# Patient Record
Sex: Female | Born: 2000 | Race: White | Hispanic: No | Marital: Single | Smoking: Never smoker
Health system: Southern US, Community
[De-identification: ages and names within clinical notes are randomized; demographics above are authoritative.]

## PROBLEM LIST (undated history)

## (undated) DIAGNOSIS — Z23 Encounter for immunization: Secondary | ICD-10-CM

## (undated) HISTORY — PX: NO PAST SURGERIES: SHX2092

## (undated) HISTORY — DX: Encounter for immunization: Z23

---
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
## 2020-10-25 NOTE — Progress Notes (Signed)
Patient, No Pcp Per   Chief Complaint  Patient presents with  . Breast Exam    Lump on RB, not painful x 2 weeks    HPI:      Ms. Dorothy Reid is a 20 y.o. No obstetric history on file. whose LMP was No LMP recorded. Patient has had an implant., presents today for NP RT breast mass for the past 2 wks, noticed on SBE. May have gotten a little larger since she first noticed it, minimally tender. No erythema, trauma, nipple d/c. No FH breast/ovar ca. Drinks minimal caffeine. No prior breast imaging, no hx of breast masses. Has occas BTB with nexplanon, replaced 06/2019. She is sex active, never had pap. Has bleeding today and declines GYN exam.    Past Medical History:  Diagnosis Date  . No pertinent past medical history     Past Surgical History:  Procedure Laterality Date  . NO PAST SURGERIES      Family History  Problem Relation Age of Onset  . Other Mother        had hysterectomy due to cancer cells  . Other Maternal Grandmother        had hysterectomy due to cancer cells    Social History   Socioeconomic History  . Marital status: Single    Spouse name: Not on file  . Number of children: Not on file  . Years of education: Not on file  . Highest education level: Not on file  Occupational History  . Not on file  Tobacco Use  . Smoking status: Never Smoker  . Smokeless tobacco: Never Used  Vaping Use  . Vaping Use: Never used  Substance and Sexual Activity  . Alcohol use: Yes  . Drug use: Never  . Sexual activity: Yes    Birth control/protection: Implant  Other Topics Concern  . Not on file  Social History Narrative  . Not on file   Social Determinants of Health   Financial Resource Strain: Not on file  Food Insecurity: Not on file  Transportation Needs: Not on file  Physical Activity: Not on file  Stress: Not on file  Social Connections: Not on file  Intimate Partner Violence: Not on file    Outpatient Medications Prior to Visit   Medication Sig Dispense Refill  . etonogestrel (NEXPLANON) 68 MG IMPL implant Inject into the skin once. Approx insertion date 06/2019     No facility-administered medications prior to visit.      ROS:  Review of Systems  Constitutional: Negative for fever.  Gastrointestinal: Negative for blood in stool, constipation, diarrhea, nausea and vomiting.  Genitourinary: Negative for dyspareunia, dysuria, flank pain, frequency, hematuria, urgency, vaginal bleeding, vaginal discharge and vaginal pain.  Musculoskeletal: Negative for back pain.  Skin: Negative for rash.   BREAST: mass   OBJECTIVE:   Vitals:  BP 110/80   Ht 5\' 1"  (1.549 m)   Wt 135 lb (61.2 kg)   BMI 25.51 kg/m   Physical Exam Vitals reviewed.  Pulmonary:     Effort: Pulmonary effort is normal.  Chest:  Breasts: Breasts are symmetrical.     Right: Mass present. No inverted nipple, nipple discharge, skin change or tenderness.     Left: No inverted nipple, mass, nipple discharge, skin change or tenderness.     Musculoskeletal:        General: Normal range of motion.     Cervical back: Normal range of motion.  Skin:  General: Skin is warm and dry.  Neurological:     General: No focal deficit present.     Mental Status: She is alert and oriented to person, place, and time.     Cranial Nerves: No cranial nerve deficit.  Psychiatric:        Mood and Affect: Mood normal.        Behavior: Behavior normal.        Thought Content: Thought content normal.        Judgment: Judgment normal.     Assessment/Plan: Breast mass, right - Plan: US BREAST LTD UNI RIGHT INC AXILLA; 10:00 position. Check breast u/s, will f/u with results.     Return in about 4 weeks (around 11/26/2020) for annual.  Cedarius Kersh B. Ilianna Bown, PA-C 10/29/2020 9:28 AM

## 2020-10-25 NOTE — Patient Instructions (Signed)
I value your feedback and you entrusting us with your care. If you get a [facility] patient survey, I would appreciate you taking the time to let us know about your experience today. Thank you! ? ? ?

## 2020-10-29 ENCOUNTER — Other Ambulatory Visit: Payer: Self-pay

## 2020-10-29 ENCOUNTER — Ambulatory Visit (INDEPENDENT_AMBULATORY_CARE_PROVIDER_SITE_OTHER): Payer: Commercial Managed Care - PPO | Admitting: Obstetrics and Gynecology

## 2020-10-29 ENCOUNTER — Encounter: Payer: Self-pay | Admitting: Obstetrics and Gynecology

## 2020-10-29 VITALS — BP 110/80 | Ht 61.0 in | Wt 135.0 lb

## 2020-10-29 DIAGNOSIS — N6310 Unspecified lump in the right breast, unspecified quadrant: Secondary | ICD-10-CM

## 2020-11-01 ENCOUNTER — Telehealth: Payer: Self-pay | Admitting: Obstetrics and Gynecology

## 2020-11-01 NOTE — Telephone Encounter (Signed)
Patient aware of time and date at [facility] on 11/06/2020 at 3:00pm.

## 2020-11-06 ENCOUNTER — Ambulatory Visit
Admission: RE | Admit: 2020-11-06 | Discharge: 2020-11-06 | Disposition: A | Discharge status: Home / Self Care | Payer: Commercial Managed Care - PPO | Source: Ambulatory Visit | Attending: Obstetrics and Gynecology | Admitting: Obstetrics and Gynecology

## 2020-11-06 ENCOUNTER — Other Ambulatory Visit: Payer: Self-pay

## 2020-11-06 DIAGNOSIS — N6310 Unspecified lump in the right breast, unspecified quadrant: Secondary | ICD-10-CM | POA: Insufficient documentation

## 2020-11-08 ENCOUNTER — Encounter: Payer: Commercial Managed Care - PPO | Admitting: Obstetrics and Gynecology

## 2020-12-04 NOTE — Patient Instructions (Incomplete)
I value your feedback and you entrusting us with your care. If you get a [facility] patient survey, I would appreciate you taking the time to let us know about your experience today. Thank you! ? ? ?

## 2020-12-04 NOTE — Progress Notes (Deleted)
   PCP:  Lonie Peak, PA-C   No chief complaint on file.    HPI:      Ms. Dorothy Reid is a 20 y.o. G0P0000 whose LMP was No LMP recorded. Patient has had an implant., presents today for her annual examination.  Her menses are {norm/abn:715}, lasting {number:22536} days.  Dysmenorrhea {dysmen:716}. She {does:18564} have intermenstrual bleeding.  Sex activity: {sex active:315163}. Nexplanon replaced 06/22/19 Last Pap: never Hx of STDs: {STD hx:14358}  There is no FH of breast cancer. There is no FH of ovarian cancer. The patient {does:18564} do self-breast exams. Had RT breast mass with neg breast u/s 3/22; ridge of normal tissue seen. F/u today???  Tobacco use: {tob:20664} Alcohol use: {Alcohol:11675} No drug use.  Exercise: {exercise:31265}  She {does:18564} get adequate calcium and Vitamin D in her diet.   The pregnancy intention screening data noted above was reviewed. Potential methods of contraception were discussed. The patient elected to proceed with {Upstream End Methods:24109}.     Past Medical History:  Diagnosis Date  . Vaccine for human papilloma virus (HPV) types 6, 11, 16, and 18 administered     Past Surgical History:  Procedure Laterality Date  . NO PAST SURGERIES      Family History  Problem Relation Age of Onset  . Other Mother        had hysterectomy due to cx cancer cells  . Other Maternal Grandmother        had hysterectomy due to cx cancer cells    Social History   Socioeconomic History  . Marital status: Single    Spouse name: Not on file  . Number of children: Not on file  . Years of education: Not on file  . Highest education level: Not on file  Occupational History  . Not on file  Tobacco Use  . Smoking status: Never Smoker  . Smokeless tobacco: Never Used  Vaping Use  . Vaping Use: Never used  Substance and Sexual Activity  . Alcohol use: Yes  . Drug use: Never  . Sexual activity: Yes    Birth control/protection:  Implant  Other Topics Concern  . Not on file  Social History Narrative  . Not on file   Social Determinants of Health   Financial Resource Strain: Not on file  Food Insecurity: Not on file  Transportation Needs: Not on file  Physical Activity: Not on file  Stress: Not on file  Social Connections: Not on file  Intimate Partner Violence: Not on file     Current Outpatient Medications:  .  etonogestrel (NEXPLANON) 68 MG IMPL implant, Inject into the skin once. Approx insertion date 06/2019, Disp: , Rfl:      ROS:  Review of Systems BREAST: No symptoms   Objective: There were no vitals taken for this visit.   OBGyn Exam  Results: No results found for this or any previous visit (from the past 24 hour(s)).  Assessment/Plan: No diagnosis found.  No orders of the defined types were placed in this encounter.            GYN counsel {counseling:16159}     F/U  No follow-ups on file.  Derisha Funderburke B. Reginold Beale, PA-C 12/04/2020 2:49 PM

## 2020-12-05 ENCOUNTER — Ambulatory Visit: Payer: Commercial Managed Care - PPO | Admitting: Obstetrics and Gynecology

## 2020-12-05 DIAGNOSIS — Z124 Encounter for screening for malignant neoplasm of cervix: Secondary | ICD-10-CM

## 2020-12-05 DIAGNOSIS — Z01419 Encounter for gynecological examination (general) (routine) without abnormal findings: Secondary | ICD-10-CM

## 2020-12-05 DIAGNOSIS — Z113 Encounter for screening for infections with a predominantly sexual mode of transmission: Secondary | ICD-10-CM

## 2020-12-05 DIAGNOSIS — N6310 Unspecified lump in the right breast, unspecified quadrant: Secondary | ICD-10-CM

## 2020-12-05 DIAGNOSIS — Z3046 Encounter for surveillance of implantable subdermal contraceptive: Secondary | ICD-10-CM

## 2021-08-11 IMAGING — US US BREAST*R* LIMITED INC AXILLA
1 series · 4 of 4 positions shown · non-contrast
Comparison: None.

CLINICAL DATA: 19-year-old female presenting with a new lump in the
right breast.

EXAM:
ULTRASOUND OF THE RIGHT BREAST

[Series 1: us breast*right* limited inc axilla · 0.06mm/px · 4 of 4 slices shown]
[im 1/4]
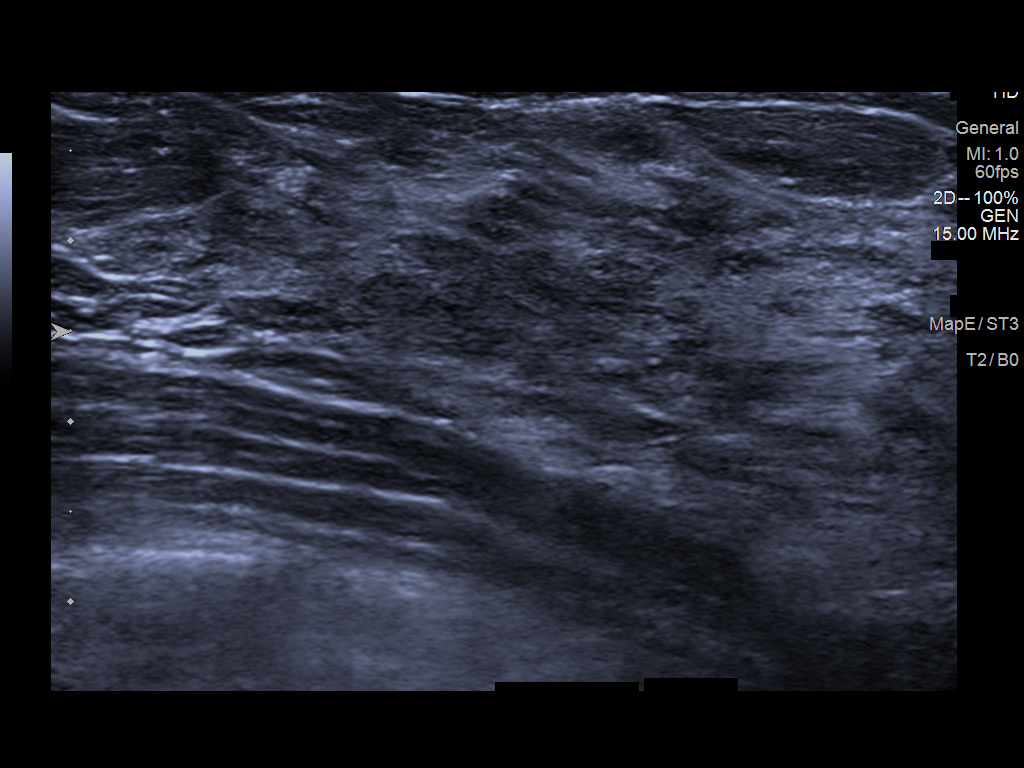
[im 2/4]
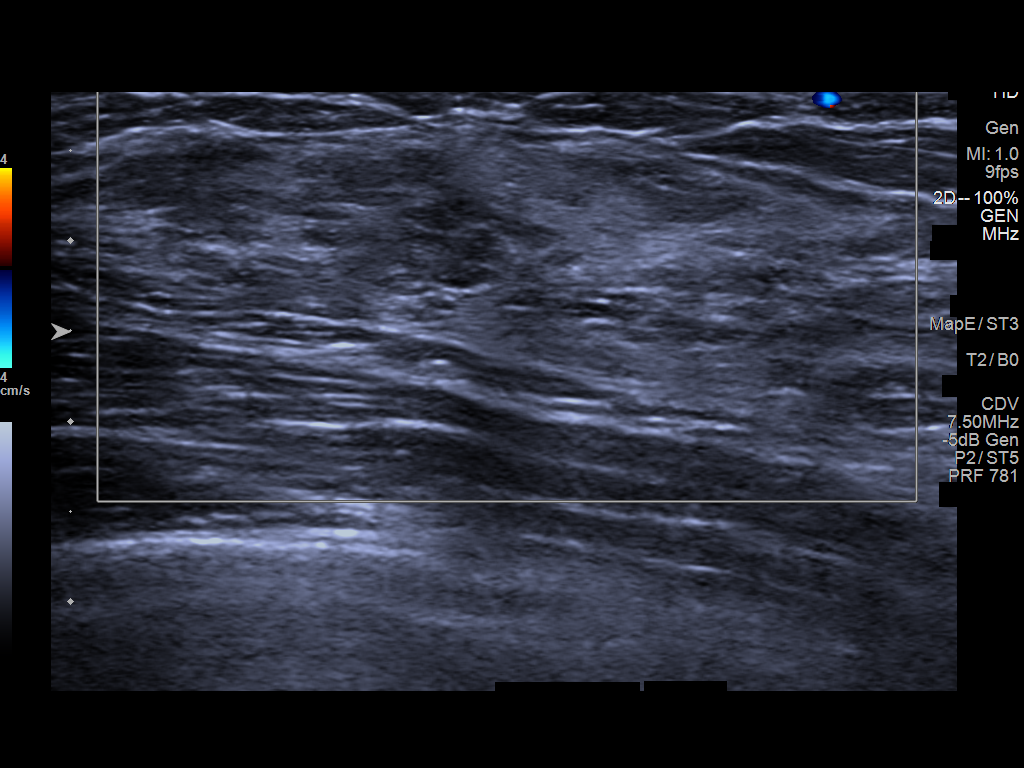
[im 3/4]
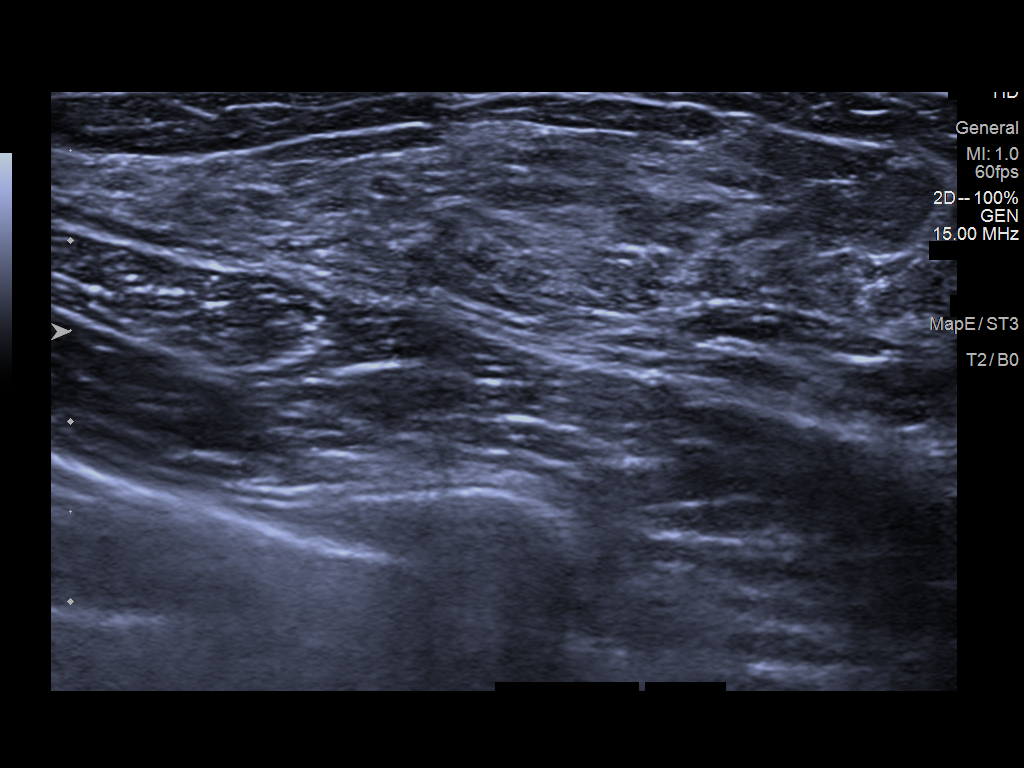
[im 4/4]
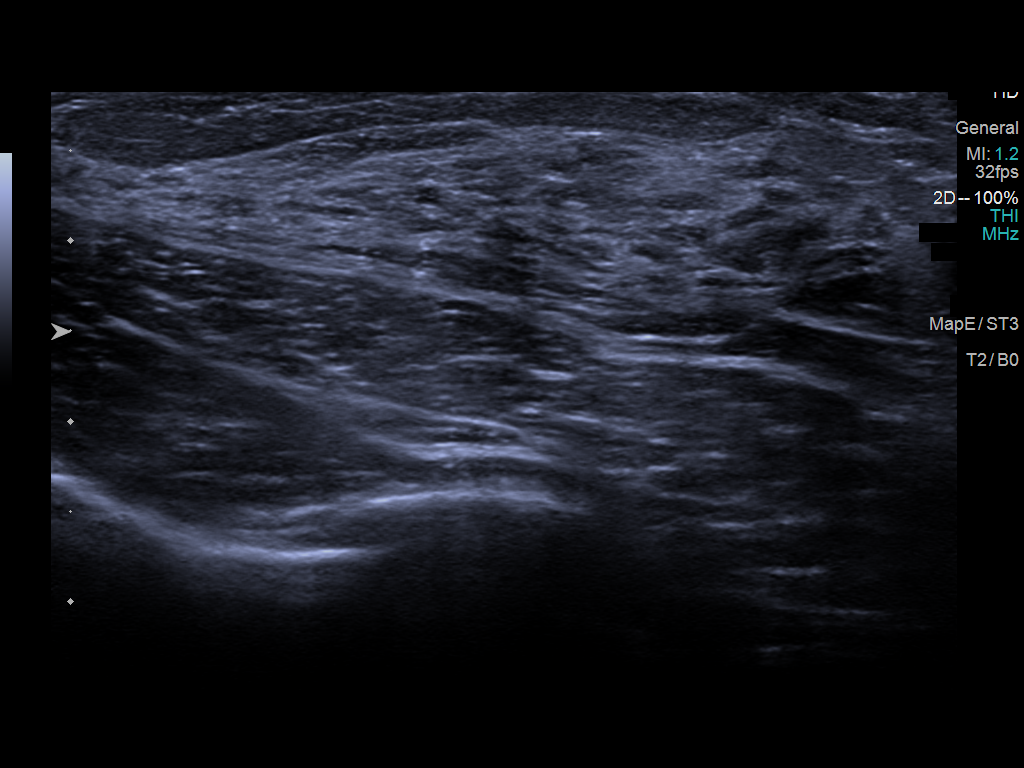

[4 of 4 positions shown; findings below may reference images not displayed]

FINDINGS: On physical exam, at the site of concern reported by the patient in
the outer right breast I do not feel a fixed discrete mass.

Targeted ultrasound is performed at the palpable site of concern in
the right breast at 10 o'clock 7 cm from the nipple demonstrating no
cystic or solid mass. There is a ridge of dense tissue which is
likely what the patient is feeling.
IMPRESSION: No mammographic evidence of malignancy or other imaging abnormality
at the palpable site of concern in the right breast.

RECOMMENDATION:
1. Recommend any further workup of the palpable site in the right
breast be on a clinical basis.

2.  Begin routine annual screening mammography at age 40.

I have discussed the findings and recommendations with the patient.
If applicable, a reminder letter will be sent to the patient
regarding the next appointment.

BI-RADS CATEGORY  1: Negative.

## 2021-10-04 ENCOUNTER — Ambulatory Visit: Payer: Managed Care, Other (non HMO) | Admitting: Advanced Practice Midwife

## 2021-10-04 ENCOUNTER — Encounter: Payer: Self-pay | Admitting: Advanced Practice Midwife

## 2021-10-04 ENCOUNTER — Other Ambulatory Visit: Payer: Self-pay

## 2021-10-04 VITALS — BP 110/70 | Ht 61.0 in | Wt 130.0 lb

## 2021-10-04 DIAGNOSIS — Z1159 Encounter for screening for other viral diseases: Secondary | ICD-10-CM

## 2021-10-04 DIAGNOSIS — Z113 Encounter for screening for infections with a predominantly sexual mode of transmission: Secondary | ICD-10-CM | POA: Diagnosis not present

## 2021-10-04 DIAGNOSIS — N898 Other specified noninflammatory disorders of vagina: Secondary | ICD-10-CM

## 2021-10-04 NOTE — Progress Notes (Signed)
° °  Patient ID: Dorothy Reid, female   DOB: 12-03-2000, 21 y.o.   MRN: [id number]  Reason for Consult: Vaginitis (Std check /)   Subjective:  HPI:  Dorothy Reid is a 21 y.o. female being seen for symptoms of vaginitis and she requests STD testing due to not using a condom recently. Her symptoms began a couple of days ago as some mild irritation and itching. She denies discharge or odor. She uses Nexplanon for birth control and she normally uses condoms. She has no other questions or concerns at this time.  Past Medical History:  Diagnosis Date   Vaccine for human papilloma virus (HPV) types 6, 37, 42, and 18 administered    Family History  Problem Relation Age of Onset   Other Mother        had hysterectomy due to cx cancer cells   Other Maternal Grandmother        had hysterectomy due to cx cancer cells   Past Surgical History:  Procedure Laterality Date   NO PAST SURGERIES      Short Social History:  Social History   Tobacco Use   Smoking status: Never   Smokeless tobacco: Never  Substance Use Topics   Alcohol use: Yes    Allergies  Allergen Reactions   Sulfa Antibiotics Rash    Current Outpatient Medications  Medication Sig Dispense Refill   etonogestrel (NEXPLANON) 68 MG IMPL implant Inject into the skin once. Approx insertion date 06/2019     No current facility-administered medications for this visit.    Review of Systems  Constitutional:  Negative for chills and fever.  HENT:  Negative for congestion, ear discharge, ear pain, hearing loss, sinus pain and sore throat.   Eyes:  Negative for blurred vision and double vision.  Respiratory:  Negative for cough, shortness of breath and wheezing.   Cardiovascular:  Negative for chest pain, palpitations and leg swelling.  Gastrointestinal:  Negative for abdominal pain, blood in stool, constipation, diarrhea, heartburn, melena, nausea and vomiting.  Genitourinary:  Negative for dysuria, flank pain, frequency,  hematuria and urgency.       Positive for vaginal irritation and itching  Musculoskeletal:  Negative for back pain, joint pain and myalgias.  Skin:  Negative for itching and rash.  Neurological:  Negative for dizziness, tingling, tremors, sensory change, speech change, focal weakness, seizures, loss of consciousness, weakness and headaches.  Endo/Heme/Allergies:  Negative for environmental allergies. Does not bruise/bleed easily.  Psychiatric/Behavioral:  Negative for depression, hallucinations, memory loss, substance abuse and suicidal ideas. The patient is not nervous/anxious and does not have insomnia.        Objective:  Objective   Vitals:   10/04/21 1615  BP: 110/70  Weight: 130 lb (59 kg)  Height: 5\' 1"  (1.549 m)   Body mass index is 24.56 kg/m. Constitutional: Well nourished, well developed female in no acute distress.  HEENT: normal Skin: Warm and dry.  Extremity:  no edema   Respiratory: Normal respiratory effort Neuro: DTRs 2+, Cranial nerves grossly intact Psych: Alert and Oriented x3. No memory deficits. Normal mood and affect.    Pelvic exam: patient self swabbed aptima   Assessment/Plan:     21 y.o. G0 female with possible vaginitis, STD testing  Aptima: vaginitis/STDs Blood work: HIV, RPR, Hep C, Hep B Follow up as needed after labs result   [facility] 10/04/2021, 4:42 PM

## 2021-10-05 ENCOUNTER — Encounter: Payer: Self-pay | Admitting: Advanced Practice Midwife

## 2021-10-05 LAB — RPR QUALITATIVE: RPR Ser Ql: NONREACTIVE

## 2021-10-05 LAB — HIV ANTIBODY (ROUTINE TESTING W REFLEX): HIV Screen 4th Generation wRfx: NONREACTIVE

## 2021-10-05 LAB — HEPATITIS C ANTIBODY: Hep C Virus Ab: NONREACTIVE

## 2021-10-05 LAB — HEPATITIS B SURFACE ANTIBODY,QUALITATIVE: Hep B Surface Ab, Qual: NONREACTIVE

## 2021-10-07 LAB — NUSWAB VAGINITIS PLUS (VG+)
Candida albicans, NAA: POSITIVE — AB
Candida glabrata, NAA: NEGATIVE
Chlamydia trachomatis, NAA: NEGATIVE
Neisseria gonorrhoeae, NAA: NEGATIVE
Trich vag by NAA: NEGATIVE

## 2021-10-10 ENCOUNTER — Other Ambulatory Visit: Payer: Self-pay | Admitting: Advanced Practice Midwife

## 2021-10-10 DIAGNOSIS — B3731 Acute candidiasis of vulva and vagina: Secondary | ICD-10-CM

## 2021-10-10 MED ORDER — FLUCONAZOLE 150 MG PO TABS: 150.0000 mg | ORAL_TABLET | Freq: Once | ORAL | 3 refills | Status: AC

## 2021-10-10 NOTE — Progress Notes (Signed)
Rx diflucan to treat yeast infection ?

## 2021-10-30 ENCOUNTER — Ambulatory Visit: Payer: Commercial Managed Care - PPO | Admitting: Obstetrics and Gynecology

## 2021-12-10 ENCOUNTER — Other Ambulatory Visit (HOSPITAL_COMMUNITY)
Admission: RE | Admit: 2021-12-10 | Discharge: 2021-12-10 | Disposition: A | Discharge status: Home / Self Care | Payer: Managed Care, Other (non HMO) | Source: Ambulatory Visit | Attending: Obstetrics and Gynecology | Admitting: Obstetrics and Gynecology

## 2021-12-10 ENCOUNTER — Encounter: Payer: Self-pay | Admitting: Obstetrics and Gynecology

## 2021-12-10 ENCOUNTER — Ambulatory Visit (INDEPENDENT_AMBULATORY_CARE_PROVIDER_SITE_OTHER): Payer: Managed Care, Other (non HMO) | Admitting: Obstetrics and Gynecology

## 2021-12-10 VITALS — BP 100/70 | Ht 61.0 in | Wt 128.0 lb

## 2021-12-10 DIAGNOSIS — Z124 Encounter for screening for malignant neoplasm of cervix: Secondary | ICD-10-CM | POA: Diagnosis present

## 2021-12-10 DIAGNOSIS — Z3046 Encounter for surveillance of implantable subdermal contraceptive: Secondary | ICD-10-CM

## 2021-12-10 DIAGNOSIS — Z01419 Encounter for gynecological examination (general) (routine) without abnormal findings: Secondary | ICD-10-CM | POA: Diagnosis not present

## 2021-12-10 NOTE — Progress Notes (Signed)
? ?PCP:  Lonie Peak, PA-C ? ? ?Chief Complaint  ?Patient presents with  ? Gynecologic Exam  ?  No concerns  ? ? ? ?HPI: ?     Ms. Dorothy Reid is a 21 y.o. G0P0000 whose LMP was No LMP recorded. Patient has had an implant., presents today for her annual examination.  Her menses are irregular with nexplanon; has mod bleeding periodically, mild dysmen.  ? ?Sex activity: not sexually active; contraception-- Nexplanon placed 06/22/19 ?Last Pap: never done in past ?Hx of STDs: none; Neg STD testing 2/23 ? ?There is no FH of breast cancer. There is no FH of ovarian cancer. The patient does do self-breast exams. Pt with RT breast mass 3/22 with neg breast u/s. Pt still feels area, no change in size. ? ?Tobacco use: The patient denies current or previous tobacco use. ?Alcohol use: social drinker ?No drug use.  ?Exercise: very active ? ?She does get adequate calcium but not Vitamin D in her diet. ?Gardasil completed  ? ?Past Medical History:  ?Diagnosis Date  ? Vaccine for human papilloma virus (HPV) types 6, 11, 16, and 18 administered   ? ? ? ?Past Surgical History:  ?Procedure Laterality Date  ? NO PAST SURGERIES    ? ? ?Family History  ?Problem Relation Age of Onset  ? Other Mother   ?     had hysterectomy due to cx cancer cells  ? Other Maternal Grandmother   ?     had hysterectomy due to cx cancer cells  ? ? ?Social History  ? ?Socioeconomic History  ? Marital status: Single  ?  Spouse name: Not on file  ? Number of children: Not on file  ? Years of education: Not on file  ? Highest education level: Not on file  ?Occupational History  ? Not on file  ?Tobacco Use  ? Smoking status: Never  ? Smokeless tobacco: Never  ?Vaping Use  ? Vaping Use: Never used  ?Substance and Sexual Activity  ? Alcohol use: Yes  ? Drug use: Never  ? Sexual activity: Not Currently  ?  Birth control/protection: Implant  ?Other Topics Concern  ? Not on file  ?Social History Narrative  ? Not on file  ? ?Social Determinants of Health   ? ?Financial Resource Strain: Not on file  ?Food Insecurity: Not on file  ?Transportation Needs: Not on file  ?Physical Activity: Not on file  ?Stress: Not on file  ?Social Connections: Not on file  ?Intimate Partner Violence: Not on file  ? ? ? ?Current Outpatient Medications:  ?  etonogestrel (NEXPLANON) 68 MG IMPL implant, Inject into the skin once. Approx insertion date 06/2019, Disp: , Rfl:  ? ? ? ? ?ROS: ? ?Review of Systems  ?Constitutional:  Negative for fatigue, fever and unexpected weight change.  ?Respiratory:  Negative for cough, shortness of breath and wheezing.   ?Cardiovascular:  Negative for chest pain, palpitations and leg swelling.  ?Gastrointestinal:  Negative for blood in stool, constipation, diarrhea, nausea and vomiting.  ?Endocrine: Negative for cold intolerance, heat intolerance and polyuria.  ?Genitourinary:  Negative for dyspareunia, dysuria, flank pain, frequency, genital sores, hematuria, menstrual problem, pelvic pain, urgency, vaginal bleeding, vaginal discharge and vaginal pain.  ?Musculoskeletal:  Negative for back pain, joint swelling and myalgias.  ?Skin:  Negative for rash.  ?Neurological:  Negative for dizziness, syncope, light-headedness, numbness and headaches.  ?Hematological:  Negative for adenopathy.  ?Psychiatric/Behavioral:  Negative for agitation, confusion, sleep disturbance and suicidal  ideas. The patient is not nervous/anxious.   ?BREAST: No symptoms ? ? ?Objective: ?BP 100/70   Ht 5\' 1"  (1.549 m)   Wt 128 lb (58.1 kg)   BMI 24.19 kg/m?  ? ? ?Physical Exam ?Constitutional:   ?   Appearance: She is well-developed.  ?Genitourinary:  ?   Vulva normal.  ?   Right Labia: No rash, tenderness or lesions. ?   Left Labia: No tenderness, lesions or rash. ?   No vaginal discharge, erythema or tenderness.  ? ?   Right Adnexa: not tender and no mass present. ?   Left Adnexa: not tender and no mass present. ?   No cervical friability or polyp.  ?   Uterus is not enlarged or  tender.  ?Breasts: ?   Right: No mass, nipple discharge, skin change or tenderness.  ?   Left: No mass, nipple discharge, skin change or tenderness.  ?Neck:  ?   Thyroid: No thyromegaly.  ?Cardiovascular:  ?   Rate and Rhythm: Normal rate and regular rhythm.  ?   Heart sounds: Normal heart sounds. No murmur heard. ?Pulmonary:  ?   Effort: Pulmonary effort is normal.  ?   Breath sounds: Normal breath sounds.  ?Abdominal:  ?   Palpations: Abdomen is soft.  ?   Tenderness: There is no abdominal tenderness. There is no guarding or rebound.  ?Musculoskeletal:     ?   General: Normal range of motion.  ?   Cervical back: Normal range of motion.  ?Lymphadenopathy:  ?   Cervical: No cervical adenopathy.  ?Neurological:  ?   General: No focal deficit present.  ?   Mental Status: She is alert and oriented to person, place, and time.  ?   Cranial Nerves: No cranial nerve deficit.  ?Skin: ?   General: Skin is warm and dry.  ?Psychiatric:     ?   Mood and Affect: Mood normal.     ?   Behavior: Behavior normal.     ?   Thought Content: Thought content normal.     ?   Judgment: Judgment normal.  ?Vitals reviewed.  ? ? ?Assessment/Plan: ?Encounter for annual routine gynecological examination ? ?Cervical cancer screening - Plan: Cytology - PAP ? ?Encounter for surveillance of implantable subdermal contraceptive; due for removal 11/23. Pt can do replacement vs other.  ? ?          ?GYN counsel adequate intake of calcium and vitamin D, diet and exercise ? ? ?  F/U ? Return in about 1 year (around 12/11/2022). ? ?Rohit Deloria B. Jurni Cesaro, PA-C ?12/10/2021 ?4:40 PM ?

## 2021-12-13 LAB — CYTOLOGY - PAP: Diagnosis: NEGATIVE

## 2022-01-28 ENCOUNTER — Telehealth: Payer: Self-pay | Admitting: Obstetrics and Gynecology

## 2022-01-28 NOTE — Telephone Encounter (Addendum)
I contacted patient via phone to scheduled Nexplanon removal and birth control options. Called and left voicemail for patient to call back to be scheduled.

## 2022-03-21 ENCOUNTER — Other Ambulatory Visit (HOSPITAL_COMMUNITY)
Admission: RE | Admit: 2022-03-21 | Discharge: 2022-03-21 | Disposition: A | Discharge status: Home / Self Care | Payer: Managed Care, Other (non HMO) | Source: Ambulatory Visit | Attending: Obstetrics | Admitting: Obstetrics

## 2022-03-21 ENCOUNTER — Ambulatory Visit: Payer: Managed Care, Other (non HMO)

## 2022-03-21 VITALS — BP 100/64 | Ht 61.0 in | Wt 130.0 lb

## 2022-03-21 DIAGNOSIS — Z113 Encounter for screening for infections with a predominantly sexual mode of transmission: Secondary | ICD-10-CM | POA: Insufficient documentation

## 2022-03-21 DIAGNOSIS — B3731 Acute candidiasis of vulva and vagina: Secondary | ICD-10-CM | POA: Insufficient documentation

## 2022-03-21 NOTE — Progress Notes (Signed)
Patient is here for nurse visit for self swab. Has been having vaginal itching and irritation for the past 3-4 days, denies vaginal discharge/odor. Would like STD testing added to swab.

## 2022-03-25 ENCOUNTER — Other Ambulatory Visit: Payer: Self-pay | Admitting: Obstetrics and Gynecology

## 2022-03-25 LAB — CERVICOVAGINAL ANCILLARY ONLY
Bacterial Vaginitis (gardnerella): POSITIVE — AB
Candida Glabrata: NEGATIVE
Candida Vaginitis: POSITIVE — AB
Chlamydia: NEGATIVE
Comment: NEGATIVE
Comment: NEGATIVE
Comment: NEGATIVE
Comment: NEGATIVE
Comment: NEGATIVE
Comment: NORMAL
Neisseria Gonorrhea: NEGATIVE
Trichomonas: NEGATIVE

## 2022-03-25 MED ORDER — METRONIDAZOLE 500 MG PO TABS: ORAL_TABLET | ORAL | 0 refills | Status: DC

## 2022-03-25 MED ORDER — FLUCONAZOLE 150 MG PO TABS: 150.0000 mg | ORAL_TABLET | Freq: Once | ORAL | 0 refills | Status: AC

## 2022-03-25 NOTE — Progress Notes (Signed)
Rx flagyl and diflucan for BV and yeast on culture, with sx.

## 2022-03-27 ENCOUNTER — Ambulatory Visit: Payer: Managed Care, Other (non HMO)

## 2022-05-01 ENCOUNTER — Encounter: Payer: Self-pay | Admitting: Obstetrics and Gynecology

## 2022-06-12 ENCOUNTER — Encounter: Payer: Self-pay | Admitting: Obstetrics and Gynecology

## 2022-06-12 ENCOUNTER — Ambulatory Visit: Payer: Managed Care, Other (non HMO) | Admitting: Obstetrics and Gynecology

## 2022-06-12 VITALS — BP 110/70 | Ht 61.0 in | Wt 127.0 lb

## 2022-06-12 DIAGNOSIS — Z3046 Encounter for surveillance of implantable subdermal contraceptive: Secondary | ICD-10-CM

## 2022-06-12 MED ORDER — ETONOGESTREL 68 MG ~~LOC~~ IMPL
68.0000 mg | DRUG_IMPLANT | Freq: Once | SUBCUTANEOUS | Status: AC
  Administered 2022-06-12: 68 mg via SUBCUTANEOUS

## 2022-06-12 NOTE — Addendum Note (Signed)
Addended by: Drenda Freeze on: 06/12/2022 02:59 PM   Modules accepted: Orders

## 2022-06-12 NOTE — Patient Instructions (Signed)
I value your feedback and you entrusting us with your care. If you get a [facility] patient survey, I would appreciate you taking the time to let us know about your experience today. Thank you!  Remove the dressing in 24 hours,  keep the incision area dry for 24 hours and remove the Steristrip in 2-3  days.  Notify us if any signs of tenderness, redness, pain, or fevers develop.   

## 2022-06-12 NOTE — Progress Notes (Signed)
   Chief Complaint  Patient presents with   Contraception    Nexplanon replacement     HPI:  Dorothy Reid is a 21 y.o. G0P0000 here for Nexplanon removal and insertion. Nexplanon placed 06/22/19, would like another one today. Has occas bleeding.   BP 110/70   Ht 5\' 1"  (1.549 m)   Wt 127 lb (57.6 kg)   BMI 24.00 kg/m    Nexplanon removal Procedure note - The Nexplanon was noted in the patient's arm and the end was identified. The skin was cleansed with a Betadine solution. A small injection of subcutaneous lidocaine with epinephrine was given over the end of the implant. An incision was made at the end of the implant. The rod was noted in the incision and grasped with a hemostat. It was noted to be intact.  Steri-Strip was placed approximating the incision. Hemostasis was noted.  Nexplanon Insertion  Patient given informed consent, signed copy in the chart, time out was performed.  Appropriate time out taken.  Patient's LEFT arm was prepped and draped in the usual sterile fashion. The ruler used to measure and mark insertion area.  Pt was prepped with betadine swab and then injected with 1.0 cc of 2% lidocaine with epinephrine. Nexplanon removed form packaging,  Device confirmed in needle, then inserted full length of needle and withdrawn per handbook instructions.  Pt insertion site covered with steri-strip and a bandage.   Minimal blood loss.  Pt tolerated the procedure well.  Assessment: Encounter for removal and reinsertion of Nexplanon - Plan: etonogestrel (NEXPLANON) implant 68 mg   Meds ordered this encounter  Medications   etonogestrel (NEXPLANON) implant 68 mg     Plan:   She was told to remove the dressing in 12-24 hours, to keep the incision area dry for 24 hours and to remove the Steristrip in 2-3  days.  Notify us if any signs of tenderness, redness, pain, or fevers develop.   Xana Bradt B. Trayvon Trumbull, PA-C 06/12/2022 2:50 PM

## 2022-06-13 ENCOUNTER — Encounter: Payer: Self-pay | Admitting: Obstetrics and Gynecology

## 2022-06-17 ENCOUNTER — Encounter: Payer: Self-pay | Admitting: Obstetrics and Gynecology

## 2022-07-10 ENCOUNTER — Encounter: Payer: Self-pay | Admitting: Obstetrics and Gynecology

## 2022-11-11 ENCOUNTER — Encounter: Payer: Self-pay | Admitting: Obstetrics and Gynecology

## 2023-03-10 ENCOUNTER — Other Ambulatory Visit: Payer: Self-pay | Admitting: Obstetrics and Gynecology

## 2023-03-10 DIAGNOSIS — Z113 Encounter for screening for infections with a predominantly sexual mode of transmission: Secondary | ICD-10-CM

## 2023-03-10 NOTE — Progress Notes (Signed)
    NURSE VISIT NOTE  Subjective:    Patient ID: Dorothy Reid, female    DOB: [birth year], 22 y.o.   MRN: [id number]  HPI  Patient is a 23 y.o. G0P0000 female who presents for {pe vag discharge desc:315065} vaginal discharge for *** {gen duration:315003}. Denies abnormal vaginal bleeding or significant pelvic pain or fever. {Actions; denies/reports/admits to:19208} {UTI Symptoms:210800002}. Patient {has/denies:315300} history of known exposure to STD.   Objective:    There were no vitals taken for this visit.   @THIS  VISIT ONLY@  Assessment:   No diagnosis found.  {vaginitis type:315262}  Plan:   GC and chlamydia DNA  probe sent to lab. Treatment: {vaginitis tx:315263} ROV prn if symptoms persist or worsen.   Loney Laurence, CMA

## 2023-03-10 NOTE — Progress Notes (Signed)
Lab orders for STD tesitng with self swab with nurse visit.

## 2023-03-11 ENCOUNTER — Ambulatory Visit (INDEPENDENT_AMBULATORY_CARE_PROVIDER_SITE_OTHER): Payer: Managed Care, Other (non HMO)

## 2023-03-11 VITALS — BP 117/82 | HR 87 | Ht 61.0 in | Wt 129.0 lb

## 2023-03-11 DIAGNOSIS — Z113 Encounter for screening for infections with a predominantly sexual mode of transmission: Secondary | ICD-10-CM

## 2023-04-10 ENCOUNTER — Ambulatory Visit: Payer: Managed Care, Other (non HMO) | Admitting: Obstetrics and Gynecology

## 2023-04-13 NOTE — Progress Notes (Unsigned)
PCP:  Lonie Peak, PA-C   No chief complaint on file.    HPI:      Dorothy Reid is a 22 y.o. G0P0000 whose LMP was No LMP recorded. Patient has had an implant., presents today for her annual examination.  Her menses are irregular with nexplanon; has mod bleeding periodically, mild dysmen.   Sex activity: not sexually active; contraception-- Nexplanon placed 06/22/19 Last Pap: 12/10/21 Results were neg Hx of STDs: none; Neg STD testing 2/23  There is no FH of breast cancer. There is no FH of ovarian cancer. The patient does do self-breast exams. Pt with RT breast mass 3/22 with neg breast u/s. Pt still feels area, no change in size.  Tobacco use: The patient denies current or previous tobacco use. Alcohol use: social drinker No drug use.  Exercise: very active  She does get adequate calcium but not Vitamin D in her diet. Gardasil completed   Past Medical History:  Diagnosis Date   Vaccine for human papilloma virus (HPV) types 6, 11, 16, and 18 administered      Past Surgical History:  Procedure Laterality Date   NO PAST SURGERIES      Family History  Problem Relation Age of Onset   Other Mother        had hysterectomy due to cx cancer cells   Other Maternal Grandmother        had hysterectomy due to cx cancer cells    Social History   Socioeconomic History   Marital status: Single    Spouse name: Not on file   Number of children: Not on file   Years of education: Not on file   Highest education level: Not on file  Occupational History   Not on file  Tobacco Use   Smoking status: Never   Smokeless tobacco: Never  Vaping Use   Vaping status: Never Used  Substance and Sexual Activity   Alcohol use: Yes   Drug use: Never   Sexual activity: Not Currently    Birth control/protection: Implant  Other Topics Concern   Not on file  Social History Narrative   Not on file   Social Determinants of Health   Financial Resource Strain: Not on file   Food Insecurity: Not on file  Transportation Needs: Not on file  Physical Activity: Not on file  Stress: Not on file  Social Connections: Not on file  Intimate Partner Violence: Not on file     Current Outpatient Medications:    etonogestrel (NEXPLANON) 68 MG IMPL implant, 1 each by Subdermal route once., Disp: , Rfl:      ROS:  Review of Systems  Constitutional:  Negative for fatigue, fever and unexpected weight change.  Respiratory:  Negative for cough, shortness of breath and wheezing.   Cardiovascular:  Negative for chest pain, palpitations and leg swelling.  Gastrointestinal:  Negative for blood in stool, constipation, diarrhea, nausea and vomiting.  Endocrine: Negative for cold intolerance, heat intolerance and polyuria.  Genitourinary:  Negative for dyspareunia, dysuria, flank pain, frequency, genital sores, hematuria, menstrual problem, pelvic pain, urgency, vaginal bleeding, vaginal discharge and vaginal pain.  Musculoskeletal:  Negative for back pain, joint swelling and myalgias.  Skin:  Negative for rash.  Neurological:  Negative for dizziness, syncope, light-headedness, numbness and headaches.  Hematological:  Negative for adenopathy.  Psychiatric/Behavioral:  Negative for agitation, confusion, sleep disturbance and suicidal ideas. The patient is not nervous/anxious.    BREAST: No symptoms  Objective: There were no vitals taken for this visit.   Physical Exam Constitutional:      Appearance: She is well-developed.  Genitourinary:     Vulva normal.     Right Labia: No rash, tenderness or lesions.    Left Labia: No tenderness, lesions or rash.    No vaginal discharge, erythema or tenderness.      Right Adnexa: not tender and no mass present.    Left Adnexa: not tender and no mass present.    No cervical friability or polyp.     Uterus is not enlarged or tender.  Breasts:    Right: No mass, nipple discharge, skin change or tenderness.     Left: No mass,  nipple discharge, skin change or tenderness.  Neck:     Thyroid: No thyromegaly.  Cardiovascular:     Rate and Rhythm: Normal rate and regular rhythm.     Heart sounds: Normal heart sounds. No murmur heard. Pulmonary:     Effort: Pulmonary effort is normal.     Breath sounds: Normal breath sounds.  Abdominal:     Palpations: Abdomen is soft.     Tenderness: There is no abdominal tenderness. There is no guarding or rebound.  Musculoskeletal:        General: Normal range of motion.     Cervical back: Normal range of motion.  Lymphadenopathy:     Cervical: No cervical adenopathy.  Neurological:     General: No focal deficit present.     Mental Status: She is alert and oriented to person, place, and time.     Cranial Nerves: No cranial nerve deficit.  Skin:    General: Skin is warm and dry.  Psychiatric:        Mood and Affect: Mood normal.        Behavior: Behavior normal.        Thought Content: Thought content normal.        Judgment: Judgment normal.  Vitals reviewed.     Assessment/Plan: Encounter for annual routine gynecological examination  Cervical cancer screening - Plan: Cytology - PAP  Encounter for surveillance of implantable subdermal contraceptive; due for removal 11/23. Pt can do replacement vs other.             GYN counsel adequate intake of calcium and vitamin D, diet and exercise     F/U  No follow-ups on file.  Demarri Elie B. Taylore Hinde, PA-C 04/13/2023 7:30 PM

## 2023-04-14 ENCOUNTER — Encounter: Payer: Self-pay | Admitting: Obstetrics and Gynecology

## 2023-04-14 ENCOUNTER — Ambulatory Visit: Payer: Managed Care, Other (non HMO) | Admitting: Obstetrics and Gynecology

## 2023-04-14 VITALS — BP 102/64 | Ht 61.0 in | Wt 128.0 lb

## 2023-04-14 DIAGNOSIS — N6311 Unspecified lump in the right breast, upper outer quadrant: Secondary | ICD-10-CM

## 2023-04-14 DIAGNOSIS — Z01419 Encounter for gynecological examination (general) (routine) without abnormal findings: Secondary | ICD-10-CM | POA: Diagnosis not present

## 2023-04-14 DIAGNOSIS — Z3046 Encounter for surveillance of implantable subdermal contraceptive: Secondary | ICD-10-CM

## 2023-04-14 NOTE — Patient Instructions (Signed)
I value your feedback and you entrusting us with your care. If you get a [facility] patient survey, I would appreciate you taking the time to let us know about your experience today. Thank you! ? ? ?

## 2023-04-30 ENCOUNTER — Ambulatory Visit
Admission: RE | Admit: 2023-04-30 | Discharge: 2023-04-30 | Disposition: A | Discharge status: Home / Self Care | Payer: Managed Care, Other (non HMO) | Source: Ambulatory Visit | Attending: Obstetrics and Gynecology | Admitting: Obstetrics and Gynecology

## 2023-04-30 ENCOUNTER — Encounter: Payer: Self-pay | Admitting: Obstetrics and Gynecology

## 2023-04-30 DIAGNOSIS — N6311 Unspecified lump in the right breast, upper outer quadrant: Secondary | ICD-10-CM | POA: Diagnosis present

## 2023-04-30 NOTE — Addendum Note (Signed)
Addended by: Althea Grimmer B on: 04/30/2023 06:44 PM   Modules accepted: Orders

## 2023-06-11 ENCOUNTER — Ambulatory Visit
Admission: RE | Admit: 2023-06-11 | Discharge: 2023-06-11 | Disposition: A | Discharge status: Home / Self Care | Payer: Managed Care, Other (non HMO) | Source: Ambulatory Visit | Attending: Obstetrics and Gynecology | Admitting: Obstetrics and Gynecology

## 2023-06-11 DIAGNOSIS — N6311 Unspecified lump in the right breast, upper outer quadrant: Secondary | ICD-10-CM | POA: Insufficient documentation

## 2023-06-14 ENCOUNTER — Encounter: Payer: Self-pay | Admitting: Obstetrics and Gynecology

## 2023-06-14 ENCOUNTER — Other Ambulatory Visit: Payer: Self-pay | Admitting: Obstetrics and Gynecology

## 2023-06-14 DIAGNOSIS — N6311 Unspecified lump in the right breast, upper outer quadrant: Secondary | ICD-10-CM

## 2023-10-06 ENCOUNTER — Encounter: Payer: Self-pay | Admitting: Obstetrics and Gynecology

## 2023-11-23 NOTE — Progress Notes (Deleted)
   No chief complaint on file.    History of Present Illness:  Dorothy Reid is a 23 y.o. that had a nexplanon REplaced 06/12/22   Breast/nipple tenderness 2/25  There were no vitals taken for this visit.   Nexplanon removal Procedure note - The Nexplanon was noted in the patient's arm and the end was identified. The skin was cleansed with a Betadine solution. A small injection of subcutaneous lidocaine with epinephrine was given over the end of the implant. An incision was made at the end of the implant. The rod was noted in the incision and grasped with a hemostat. It was noted to be intact.  Steri-Strip was placed approximating the incision. Hemostasis was noted.  Assessment: Nexplanon removal   Plan:   She was told to remove the dressing in 12-24 hours, to keep the incision area dry for 24 hours and to remove the Steristrip in 2-3  days.  Notify us  if any signs of tenderness, redness, pain, or fevers develop.   Correne Lalani B. Brodan Grewell, PA-C 11/23/2023 4:45 PM

## 2023-11-24 ENCOUNTER — Ambulatory Visit: Admitting: Obstetrics and Gynecology

## 2023-11-24 DIAGNOSIS — Z3046 Encounter for surveillance of implantable subdermal contraceptive: Secondary | ICD-10-CM

## 2023-12-15 ENCOUNTER — Ambulatory Visit
Admission: RE | Admit: 2023-12-15 | Discharge: 2023-12-15 | Disposition: A | Discharge status: Home / Self Care | Payer: Managed Care, Other (non HMO) | Source: Ambulatory Visit | Attending: Obstetrics and Gynecology | Admitting: Obstetrics and Gynecology

## 2023-12-15 DIAGNOSIS — N6311 Unspecified lump in the right breast, upper outer quadrant: Secondary | ICD-10-CM | POA: Diagnosis present

## 2023-12-16 ENCOUNTER — Encounter: Payer: Self-pay | Admitting: Obstetrics and Gynecology

## 2023-12-29 ENCOUNTER — Ambulatory Visit: Admitting: Obstetrics and Gynecology

## 2023-12-29 ENCOUNTER — Encounter: Payer: Self-pay | Admitting: Obstetrics and Gynecology

## 2023-12-29 VITALS — BP 131/84 | HR 116 | Ht 61.0 in | Wt 131.0 lb

## 2023-12-29 DIAGNOSIS — Z3046 Encounter for surveillance of implantable subdermal contraceptive: Secondary | ICD-10-CM | POA: Diagnosis not present

## 2023-12-29 DIAGNOSIS — Z3009 Encounter for other general counseling and advice on contraception: Secondary | ICD-10-CM

## 2023-12-29 NOTE — Progress Notes (Signed)
 Aloha Arnold, PA-C   Chief Complaint  Patient presents with   Nexplanon  Removal    Due to irregular vaginal bleeding. Within 5 weeks, had 4 cycles. Some heavy bleeding, severe cramping. Not interested in new birth control, would like a break from it.    HPI:      Ms. Dorothy Reid is a 23 y.o. G0P0000 whose LMP was No LMP recorded. Patient has had an implant., presents today for nexplanon  removal due to AUB since 2/25.  Having mod flow and dysmen, improved with midol. Had random lighter bleeding at 9/24 annual. Nexplanon  REplaced 06/12/22. She declines BC for now and plans to use condoms. May want OCPs in future; no hx of HTN, DVTs, migraines with aura. No new partners since 9/24 STD testing.  Neg pap 5/23.    Past Medical History:  Diagnosis Date   Vaccine for human papilloma virus (HPV) types 6, 11, 16, and 18 administered     Past Surgical History:  Procedure Laterality Date   NO PAST SURGERIES      Family History  Problem Relation Age of Onset   Other Mother        had hysterectomy due to cx cancer cells   Other Maternal Grandmother        had hysterectomy due to cx cancer cells    Social History   Socioeconomic History   Marital status: Single    Spouse name: Not on file   Number of children: Not on file   Years of education: Not on file   Highest education level: Not on file  Occupational History   Not on file  Tobacco Use   Smoking status: Never   Smokeless tobacco: Never  Vaping Use   Vaping status: Never Used  Substance and Sexual Activity   Alcohol use: Yes    Comment: soc   Drug use: Never   Sexual activity: Yes    Birth control/protection: Implant  Other Topics Concern   Not on file  Social History Narrative   Not on file   Social Drivers of Health   Financial Resource Strain: Not on file  Food Insecurity: Not on file  Transportation Needs: Not on file  Physical Activity: Not on file  Stress: Not on file  Social Connections: Not on  file  Intimate Partner Violence: Not on file    Outpatient Medications Prior to Visit  Medication Sig Dispense Refill   etonogestrel  (NEXPLANON ) 68 MG IMPL implant 1 each by Subdermal route once.     No facility-administered medications prior to visit.      ROS:  Review of Systems  Constitutional:  Negative for fever.  Gastrointestinal:  Negative for blood in stool, constipation, diarrhea, nausea and vomiting.  Genitourinary:  Negative for dyspareunia, dysuria, flank pain, frequency, hematuria, urgency, vaginal bleeding, vaginal discharge and vaginal pain.  Musculoskeletal:  Negative for back pain.  Skin:  Negative for rash.   BREAST: No symptoms   OBJECTIVE:   Vitals:  BP 131/84   Pulse (!) 116   Ht 5\' 1"  (1.549 m)   Wt 131 lb (59.4 kg)   BMI 24.75 kg/m   Physical Exam Vitals reviewed.  Constitutional:      Appearance: She is well-developed.  Pulmonary:     Effort: Pulmonary effort is normal.  Musculoskeletal:        General: Normal range of motion.     Cervical back: Normal range of motion.  Skin:    General:  Skin is warm and dry.  Neurological:     General: No focal deficit present.     Mental Status: She is alert and oriented to person, place, and time.     Cranial Nerves: No cranial nerve deficit.  Psychiatric:        Mood and Affect: Mood normal.        Behavior: Behavior normal.        Thought Content: Thought content normal.        Judgment: Judgment normal.     Nexplanon  removal Procedure note - The Nexplanon  was noted in the patient's arm and the end was identified. The skin was cleansed with a Betadine solution. A small injection of subcutaneous lidocaine with epinephrine was given over the end of the implant. An incision was made at the end of the implant. The rod was noted in the incision and grasped with a hemostat. It was noted to be intact.  Steri-Strip was placed approximating the incision. Hemostasis was  noted.  Assessment/Plan: Encounter for other general counseling or advice on contraception; BC options discussed. Pt considering OCPs. Will call for Rx prn and start with menses. Condoms in meantime.   Nexplanon  removal--She was told to remove the dressing in 12-24 hours, to keep the incision area dry for 24 hours and to remove the Steristrip in 2-3  days.  Notify us  if any signs of tenderness, redness, pain, or fevers develop.   Return in about 4 months (around 04/30/2024) for annual.  Leith Szafranski B. Makaya Juneau, PA-C 12/29/2023 2:52 PM

## 2023-12-29 NOTE — Patient Instructions (Addendum)
I value your feedback and you entrusting [place] with your care. If you get a [facility] patient survey, I would appreciate you taking the time to let us know about your experience today. Thank you!  Remove the dressing in 24 hours,  keep the incision area dry for 24 hours and remove the Steristrip in 2-3  days.  Notify us if any signs of tenderness, redness, pain, or fevers develop.

## 2024-02-25 ENCOUNTER — Encounter: Payer: Self-pay | Admitting: Obstetrics and Gynecology

## 2024-03-04 ENCOUNTER — Telehealth: Payer: Self-pay

## 2024-03-04 NOTE — Telephone Encounter (Signed)
 Pt had removed nexplanon  in May had one regular period and one irregular had more clotting some more cramps took pregnancy test that was negative, I let her her know this can be normal but if soaking a pad every hour to go to ed , I let her know we could also place beta labs if that would reassure her as she was concerned for miscarriage she sated she had the negative test but would see how she felt over the weekend.

## 2024-06-16 NOTE — Progress Notes (Unsigned)
 PCP:  Montey Lot, PA-C   No chief complaint on file.    HPI:      Ms. Dorothy Reid is a 23 y.o. G0P0000 whose LMP was No LMP recorded. Patient has had an implant., presents today for her annual examination.  Her menses are random with nexplanon ; light bleeding for 2-3 days, mild dysmen. Was having bloating and fatigue, better now. no hx of HTN, DVTs, migraines with aura.   Sex activity: currently sexually active; contraception-- Nexplanon  REplaced 06/12/22, removed 5/25 due to AUB. No pain/bleeding; started to have some vag dryness. Drinks a lot of water.  Last Pap: 12/10/21 Results were neg Hx of STDs: none; Neg STD testing 7/24  There is no FH of breast cancer. There is no FH of ovarian cancer. The patient does do self-breast exams. Pt with RT breast mass 3/22 with neg breast u/s. Pt still feels area, no change in size that she knows of.   Tobacco use: The patient denies current or previous tobacco use. Alcohol use: social drinker No drug use.  Exercise: very active  She does get adequate calcium but not Vitamin D in her diet. Gardasil completed   Past Medical History:  Diagnosis Date   Vaccine for human papilloma virus (HPV) types 6, 11, 16, and 18 administered      Past Surgical History:  Procedure Laterality Date   NO PAST SURGERIES      Family History  Problem Relation Age of Onset   Other Mother        had hysterectomy due to cx cancer cells   Other Maternal Grandmother        had hysterectomy due to cx cancer cells    Social History   Socioeconomic History   Marital status: Single    Spouse name: Not on file   Number of children: Not on file   Years of education: Not on file   Highest education level: Not on file  Occupational History   Not on file  Tobacco Use   Smoking status: Never   Smokeless tobacco: Never  Vaping Use   Vaping status: Never Used  Substance and Sexual Activity   Alcohol use: Yes    Comment: soc   Drug use: Never    Sexual activity: Yes    Birth control/protection: Implant  Other Topics Concern   Not on file  Social History Narrative   Not on file   Social Drivers of Health   Financial Resource Strain: Not on file  Food Insecurity: Not on file  Transportation Needs: Not on file  Physical Activity: Not on file  Stress: Not on file  Social Connections: Not on file  Intimate Partner Violence: Not on file     Current Outpatient Medications:    etonogestrel  (NEXPLANON ) 68 MG IMPL implant, 1 each by Subdermal route once., Disp: , Rfl:      ROS:  Review of Systems  Constitutional:  Negative for fatigue, fever and unexpected weight change.  Respiratory:  Negative for cough, shortness of breath and wheezing.   Cardiovascular:  Negative for chest pain, palpitations and leg swelling.  Gastrointestinal:  Negative for blood in stool, constipation, diarrhea, nausea and vomiting.  Endocrine: Negative for cold intolerance, heat intolerance and polyuria.  Genitourinary:  Negative for dyspareunia, dysuria, flank pain, frequency, genital sores, hematuria, menstrual problem, pelvic pain, urgency, vaginal bleeding, vaginal discharge and vaginal pain.  Musculoskeletal:  Negative for back pain, joint swelling and myalgias.  Skin:  Negative  for rash.  Neurological:  Negative for dizziness, syncope, light-headedness, numbness and headaches.  Hematological:  Negative for adenopathy.  Psychiatric/Behavioral:  Negative for agitation, confusion, sleep disturbance and suicidal ideas. The patient is not nervous/anxious.    BREAST: No symptoms   Objective: There were no vitals taken for this visit.   Physical Exam Constitutional:      Appearance: She is well-developed.  Genitourinary:     Vulva normal.     Right Labia: No rash, tenderness or lesions.    Left Labia: No tenderness, lesions or rash.    No vaginal discharge, erythema or tenderness.      Right Adnexa: not tender and no mass present.    Left  Adnexa: not tender and no mass present.    No cervical friability or polyp.     Uterus is not enlarged or tender.  Breasts:    Right: Mass present. No nipple discharge, skin change or tenderness.     Left: No mass, nipple discharge, skin change or tenderness.  Neck:     Thyroid: No thyromegaly.  Cardiovascular:     Rate and Rhythm: Normal rate and regular rhythm.     Heart sounds: Normal heart sounds. No murmur heard. Pulmonary:     Effort: Pulmonary effort is normal.     Breath sounds: Normal breath sounds.  Chest:    Abdominal:     Palpations: Abdomen is soft.     Tenderness: There is no abdominal tenderness. There is no guarding or rebound.  Musculoskeletal:        General: Normal range of motion.     Cervical back: Normal range of motion.  Lymphadenopathy:     Cervical: No cervical adenopathy.  Neurological:     General: No focal deficit present.     Mental Status: She is alert and oriented to person, place, and time.     Cranial Nerves: No cranial nerve deficit.  Skin:    General: Skin is warm and dry.  Psychiatric:        Mood and Affect: Mood normal.        Behavior: Behavior normal.        Thought Content: Thought content normal.        Judgment: Judgment normal.  Vitals reviewed.     Assessment/Plan: Encounter for annual routine gynecological examination  Encounter for surveillance of implantable subdermal contraceptive;  has 3 yr indication  Mass of upper outer quadrant of right breast - Plan: US  LIMITED ULTRASOUND INCLUDING AXILLA RIGHT BREAST; new mass RT breast near nipple 9:00; pt to schedule u/s. Will f/u with results.            GYN counsel adequate intake of calcium and vitamin D, diet and exercise     F/U  No follow-ups on file.  Zeev Deakins B. Marcell Pfeifer, PA-C 06/16/2024 1:58 PM

## 2024-06-17 ENCOUNTER — Ambulatory Visit (INDEPENDENT_AMBULATORY_CARE_PROVIDER_SITE_OTHER): Admitting: Obstetrics and Gynecology

## 2024-06-17 ENCOUNTER — Encounter: Payer: Self-pay | Admitting: Obstetrics and Gynecology

## 2024-06-17 VITALS — BP 110/73 | HR 92 | Ht 61.0 in | Wt 135.0 lb

## 2024-06-17 DIAGNOSIS — Z1329 Encounter for screening for other suspected endocrine disorder: Secondary | ICD-10-CM

## 2024-06-17 DIAGNOSIS — Z01411 Encounter for gynecological examination (general) (routine) with abnormal findings: Secondary | ICD-10-CM | POA: Diagnosis not present

## 2024-06-17 DIAGNOSIS — Z0000 Encounter for general adult medical examination without abnormal findings: Secondary | ICD-10-CM

## 2024-06-17 DIAGNOSIS — Z124 Encounter for screening for malignant neoplasm of cervix: Secondary | ICD-10-CM

## 2024-06-17 DIAGNOSIS — N6012 Diffuse cystic mastopathy of left breast: Secondary | ICD-10-CM | POA: Diagnosis not present

## 2024-06-17 DIAGNOSIS — N6011 Diffuse cystic mastopathy of right breast: Secondary | ICD-10-CM

## 2024-06-17 DIAGNOSIS — R7989 Other specified abnormal findings of blood chemistry: Secondary | ICD-10-CM

## 2024-06-17 DIAGNOSIS — Z01419 Encounter for gynecological examination (general) (routine) without abnormal findings: Secondary | ICD-10-CM

## 2024-06-17 DIAGNOSIS — Z113 Encounter for screening for infections with a predominantly sexual mode of transmission: Secondary | ICD-10-CM

## 2024-06-17 NOTE — Patient Instructions (Signed)
 I value your feedback and you entrusting [place] with your care. If you get a [facility] patient survey, I would appreciate you taking the time to let us know about your experience today. Thank you! ? ? ?

## 2024-06-18 ENCOUNTER — Encounter: Payer: Self-pay | Admitting: Obstetrics and Gynecology

## 2024-06-18 DIAGNOSIS — N926 Irregular menstruation, unspecified: Secondary | ICD-10-CM

## 2024-06-18 LAB — COMPREHENSIVE METABOLIC PANEL WITH GFR
ALT: 11 IU/L (ref 0–32)
AST: 22 IU/L (ref 0–40)
Albumin: 4.6 g/dL (ref 4.0–5.0)
Alkaline Phosphatase: 53 IU/L (ref 41–116)
BUN/Creatinine Ratio: 16 (ref 9–23)
BUN: 18 mg/dL (ref 6–20)
Bilirubin Total: 0.3 mg/dL (ref 0.0–1.2)
CO2: 23 mmol/L (ref 20–29)
Calcium: 9.5 mg/dL (ref 8.7–10.2)
Chloride: 102 mmol/L (ref 96–106)
Creatinine, Ser: 1.11 mg/dL — ABNORMAL HIGH (ref 0.57–1.00)
Globulin, Total: 2.5 g/dL (ref 1.5–4.5)
Glucose: 72 mg/dL (ref 70–99)
Potassium: 3.9 mmol/L (ref 3.5–5.2)
Sodium: 140 mmol/L (ref 134–144)
Total Protein: 7.1 g/dL (ref 6.0–8.5)
eGFR: 71 mL/min/1.73 (ref >59)

## 2024-06-18 LAB — HIV ANTIBODY (ROUTINE TESTING W REFLEX): HIV Screen 4th Generation wRfx: NONREACTIVE

## 2024-06-18 LAB — CBC WITH DIFFERENTIAL/PLATELET
Basophils Absolute: 0 x10E3/uL (ref 0.0–0.2)
Basos: 0 %
EOS (ABSOLUTE): 0.2 x10E3/uL (ref 0.0–0.4)
Eos: 2 %
Hematocrit: 42.4 % (ref 34.0–46.6)
Hemoglobin: 14.4 g/dL (ref 11.1–15.9)
Immature Grans (Abs): 0 x10E3/uL (ref 0.0–0.1)
Immature Granulocytes: 0 %
Lymphocytes Absolute: 2.6 x10E3/uL (ref 0.7–3.1)
Lymphs: 27 %
MCH: 31.6 pg (ref 26.6–33.0)
MCHC: 34 g/dL (ref 31.5–35.7)
MCV: 93 fL (ref 79–97)
Monocytes Absolute: 0.6 x10E3/uL (ref 0.1–0.9)
Monocytes: 6 %
Neutrophils Absolute: 6.4 x10E3/uL (ref 1.4–7.0)
Neutrophils: 65 %
Platelets: 299 x10E3/uL (ref 150–450)
RBC: 4.55 x10E6/uL (ref 3.77–5.28)
RDW: 11.7 % (ref 11.7–15.4)
WBC: 9.8 x10E3/uL (ref 3.4–10.8)

## 2024-06-18 LAB — HEPATITIS C ANTIBODY: Hep C Virus Ab: NONREACTIVE

## 2024-06-18 LAB — RPR: RPR Ser Ql: NONREACTIVE

## 2024-06-18 LAB — TSH+FREE T4
Free T4: 1.12 ng/dL (ref 0.82–1.77)
TSH: 2.4 u[IU]/mL (ref 0.450–4.500)

## 2024-06-20 ENCOUNTER — Ambulatory Visit: Payer: Self-pay | Admitting: Obstetrics and Gynecology

## 2024-06-20 NOTE — Addendum Note (Signed)
 Addended by: WATT HILA B on: 06/20/2024 09:33 AM   Modules accepted: Orders

## 2024-06-21 LAB — IGP,CTNGTV,RFX APTIMA HPV ASCU
Chlamydia, Nuc. Acid Amp: NEGATIVE
Gonococcus, Nuc. Acid Amp: NEGATIVE
Trich vag by NAA: NEGATIVE

## 2024-09-05 ENCOUNTER — Encounter: Payer: Self-pay | Admitting: Obstetrics and Gynecology
